# Patient Record
Sex: Female | Born: 1985 | Race: White | Hispanic: No | Marital: Married
Health system: Southern US, Community
[De-identification: ages and names within clinical notes are randomized; demographics above are authoritative.]

---

## 2010-10-22 ENCOUNTER — Emergency Department: Payer: Self-pay | Admitting: Emergency Medicine

## 2010-10-31 ENCOUNTER — Ambulatory Visit: Payer: Self-pay | Admitting: Unknown Physician Specialty

## 2011-03-03 ENCOUNTER — Emergency Department: Payer: Self-pay | Admitting: Unknown Physician Specialty

## 2011-03-18 ENCOUNTER — Emergency Department: Payer: Self-pay | Admitting: Emergency Medicine

## 2011-03-20 ENCOUNTER — Emergency Department: Payer: Self-pay | Admitting: Emergency Medicine

## 2011-07-09 ENCOUNTER — Emergency Department: Payer: Self-pay | Admitting: Emergency Medicine

## 2011-10-23 ENCOUNTER — Observation Stay: Payer: Self-pay

## 2011-10-23 LAB — URINALYSIS, COMPLETE
Bilirubin,UR: NEGATIVE
Blood: NEGATIVE
Glucose,UR: NEGATIVE mg/dL (ref 0–75)
Leukocyte Esterase: NEGATIVE
Nitrite: NEGATIVE
Ph: 7 (ref 4.5–8.0)
Protein: NEGATIVE
RBC,UR: 10 /HPF (ref 0–5)
Squamous Epithelial: 1
WBC UR: 1 /HPF (ref 0–5)

## 2011-10-25 LAB — URINE CULTURE

## 2011-11-16 ENCOUNTER — Observation Stay: Payer: Self-pay | Admitting: Obstetrics and Gynecology

## 2011-11-20 ENCOUNTER — Observation Stay: Payer: Self-pay | Admitting: Obstetrics and Gynecology

## 2011-11-20 ENCOUNTER — Inpatient Hospital Stay: Payer: Self-pay | Admitting: Obstetrics and Gynecology

## 2011-11-20 LAB — CBC WITH DIFFERENTIAL/PLATELET
Basophil #: 0 10*3/uL (ref 0.0–0.1)
Basophil %: 0.1 %
Eosinophil #: 0 10*3/uL (ref 0.0–0.7)
Eosinophil %: 0 %
HCT: 32.2 % — ABNORMAL LOW (ref 35.0–47.0)
MCH: 33.1 pg (ref 26.0–34.0)
MCV: 97 fL (ref 80–100)
Monocyte #: 1.3 x10 3/mm — ABNORMAL HIGH (ref 0.2–0.9)
Monocyte %: 5.3 %
Neutrophil #: 21 10*3/uL — ABNORMAL HIGH (ref 1.4–6.5)
Neutrophil %: 88.5 %
Platelet: 182 10*3/uL (ref 150–440)
RDW: 13.5 % (ref 11.5–14.5)

## 2011-11-28 ENCOUNTER — Ambulatory Visit: Payer: Self-pay

## 2012-02-17 IMAGING — US US PELV - US TRANSVAGINAL
1 series · 17 of 25 positions shown · non-contrast
Comparison: none

REASON FOR EXAM: adnexal mass
COMMENTS:

[Series 1: us pelv - us transvaginal · 17 of 80 slices shown]
[im 1/80]
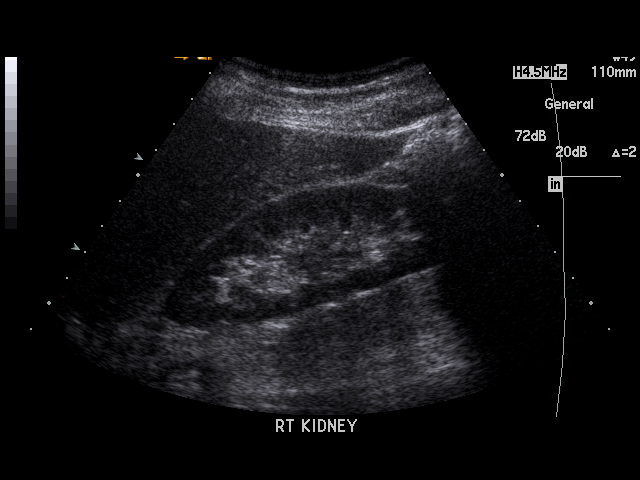
[im 7/80]
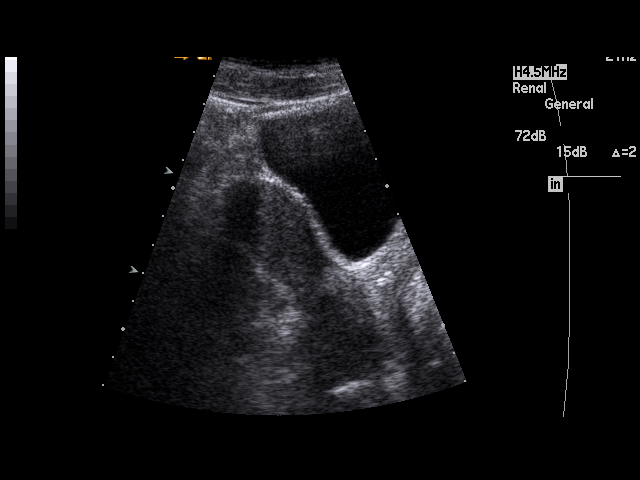
[im 10/80]
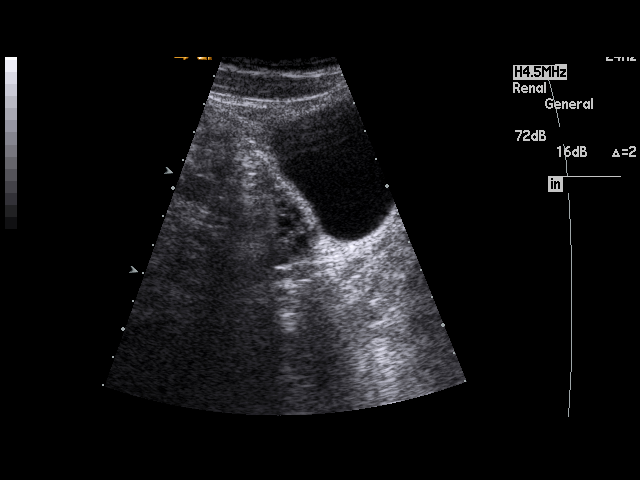
[im 17/80]
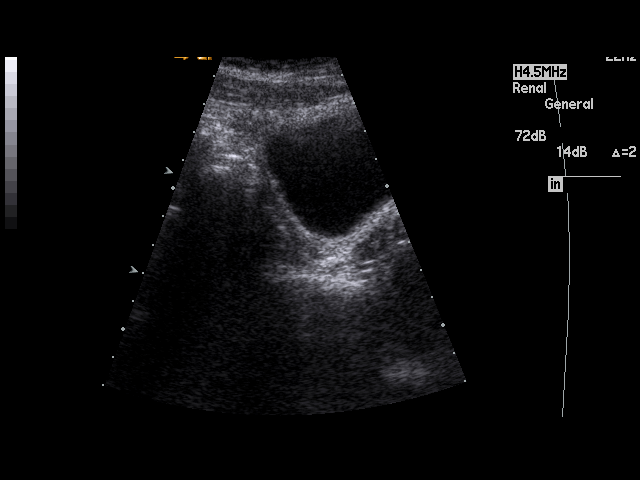
[im 20/80]
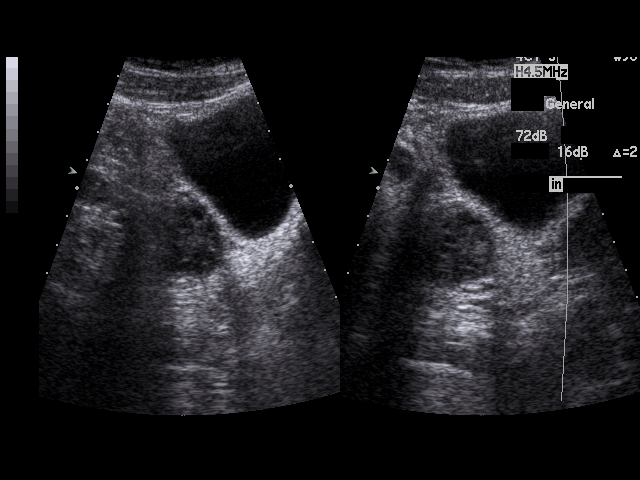
[im 27/80]
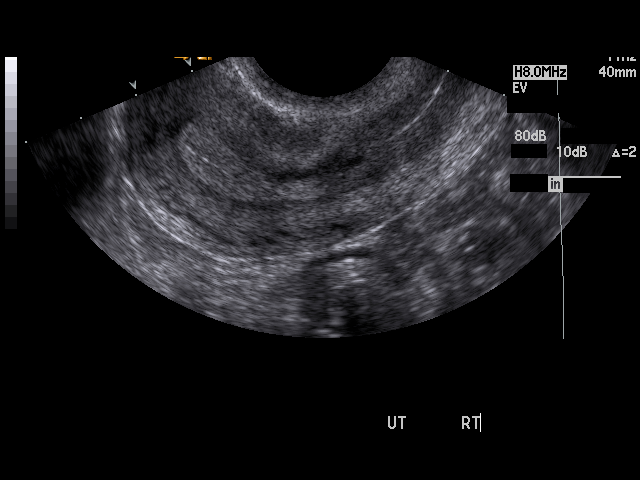
[im 30/80]
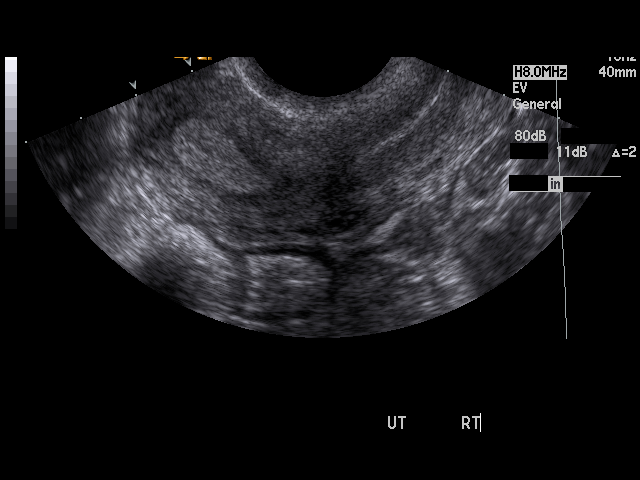
[im 37/80]
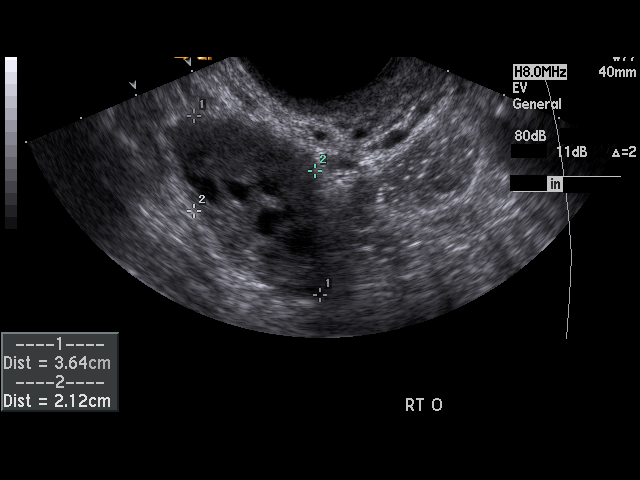
[im 40/80]
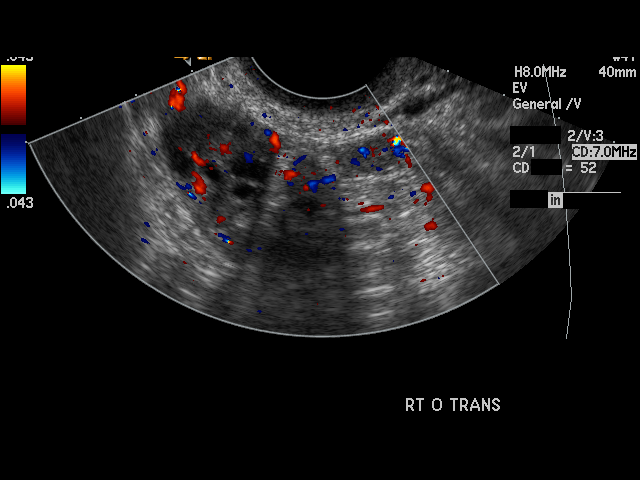
[im 43/80]
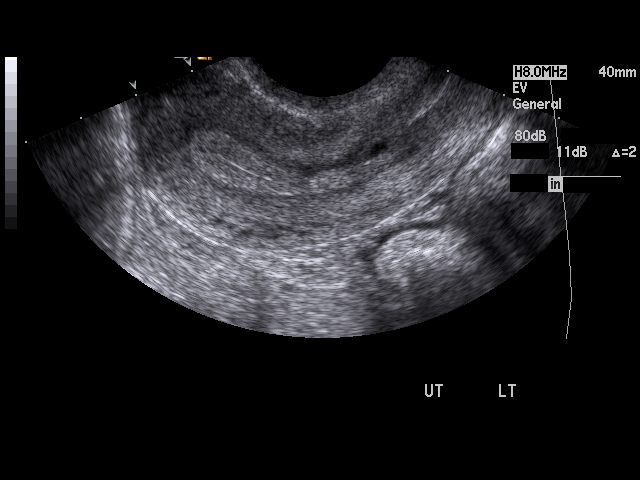
[im 50/80]
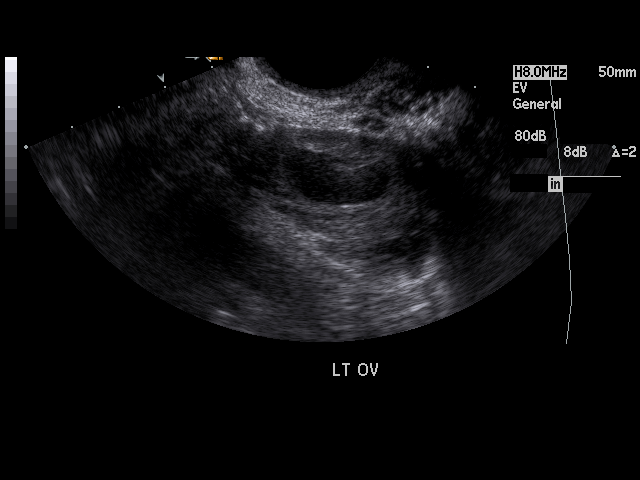
[im 53/80]
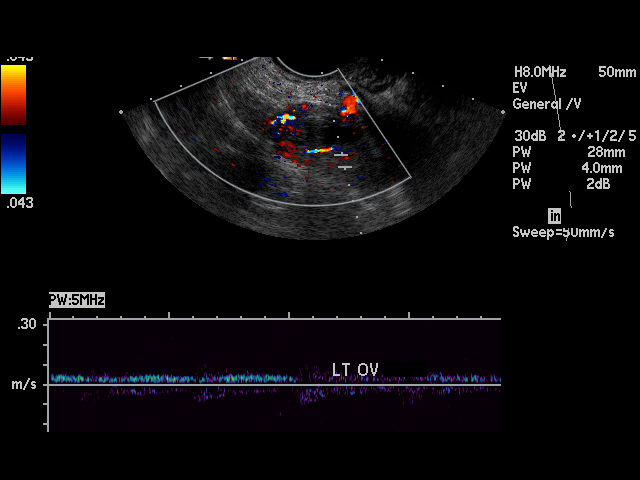
[im 60/80]
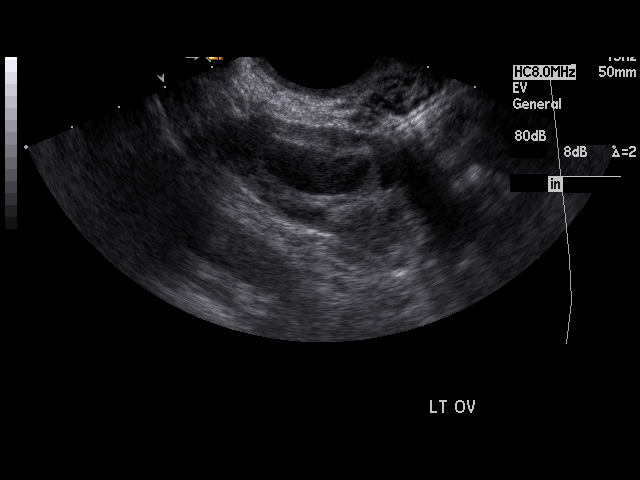
[im 63/80]
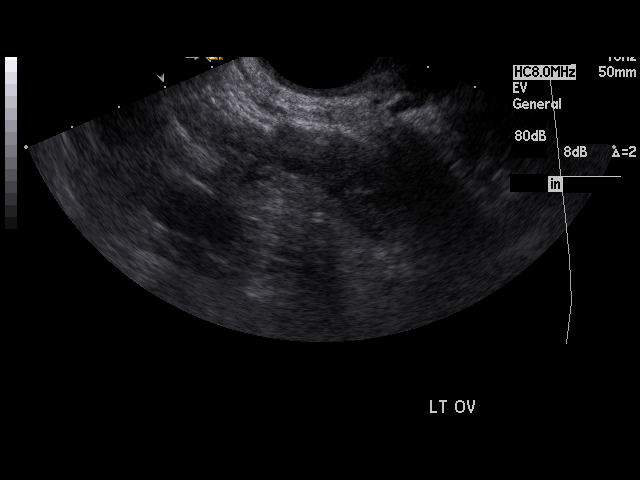
[im 70/80]
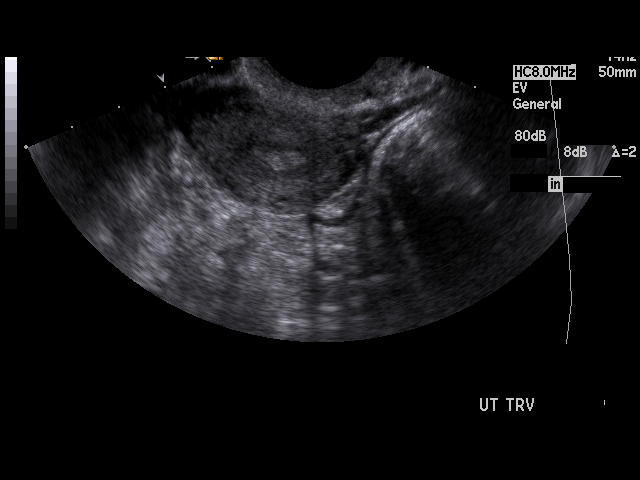
[im 73/80]
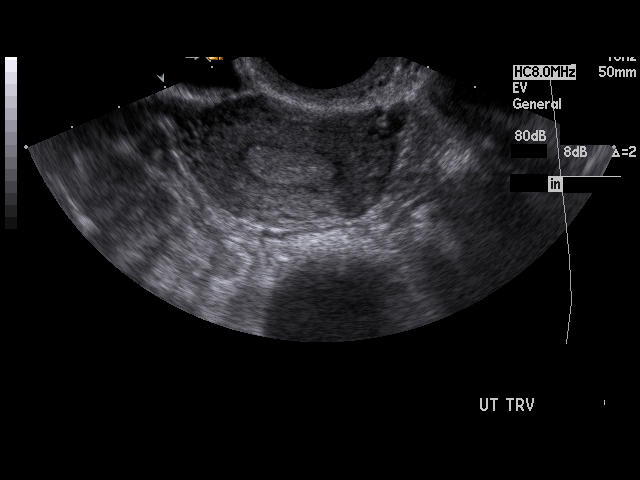
[im 80/80]
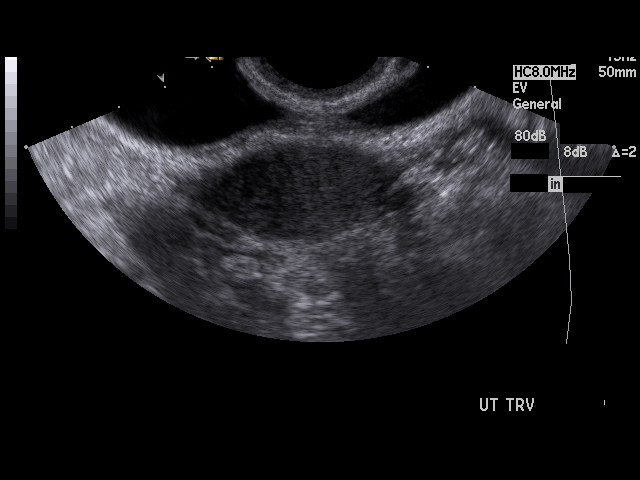

[17 of 25 positions shown; findings below may reference images not displayed]

PROCEDURE:     US  - US PELVIS EXAM W/TRANSVAGINAL  - October 31, 2010  [DATE]

RESULT:     Comparison is made to a prior study dated 10/23/2010.

The uterus measures 9.3 x 3.2 x 4.3 cm. Endometrial thickness is 9.1 mm. The
uterus demonstrates homogeneous echotexture. The right ovary demonstrates
very small follicles and otherwise a homogeneous echotexture and measures
3.6 x 2.1 x 2 cm. Color flow and arterial and venous waveforms are
identified within the right ovary.

The left ovary measures 4.3 x 2.6 x 2.8 cm. A dominant cyst is identified
within the left ovary with internal echoes measuring 2.2 cm. Arterial and
venous waveforms are identified within the left ovary.
IMPRESSION: 1.  Left ovarian cyst slightly enlarged when compared to the previous study
with internal echoes likely representing a hemorrhagic cyst.
2.  Otherwise, no significant change in Pelvic Ultrasound. Surveillance
evaluation is recommended, if and as clinically warranted.

## 2012-04-27 ENCOUNTER — Ambulatory Visit: Payer: Self-pay | Admitting: Orthopedic Surgery

## 2012-07-15 ENCOUNTER — Emergency Department: Payer: Self-pay | Admitting: Emergency Medicine

## 2012-08-06 ENCOUNTER — Encounter: Payer: Self-pay | Admitting: Anesthesiology

## 2012-08-22 ENCOUNTER — Encounter: Payer: Self-pay | Admitting: Anesthesiology

## 2013-11-01 IMAGING — CR DG HIP COMPLETE 2+V*L*
1 series · 2 of 2 positions shown · non-contrast
Comparison: none

REASON FOR EXAM: fall
COMMENTS:

PROCEDURE:     DXR - DXR HIP LEFT COMPLETE  - July 15, 2012  [DATE]
RESULT:     Comparison:  None

[Series 1: t hip ap left · 0.14mm/px · 2 of 2 slices shown]
[im 1/2]
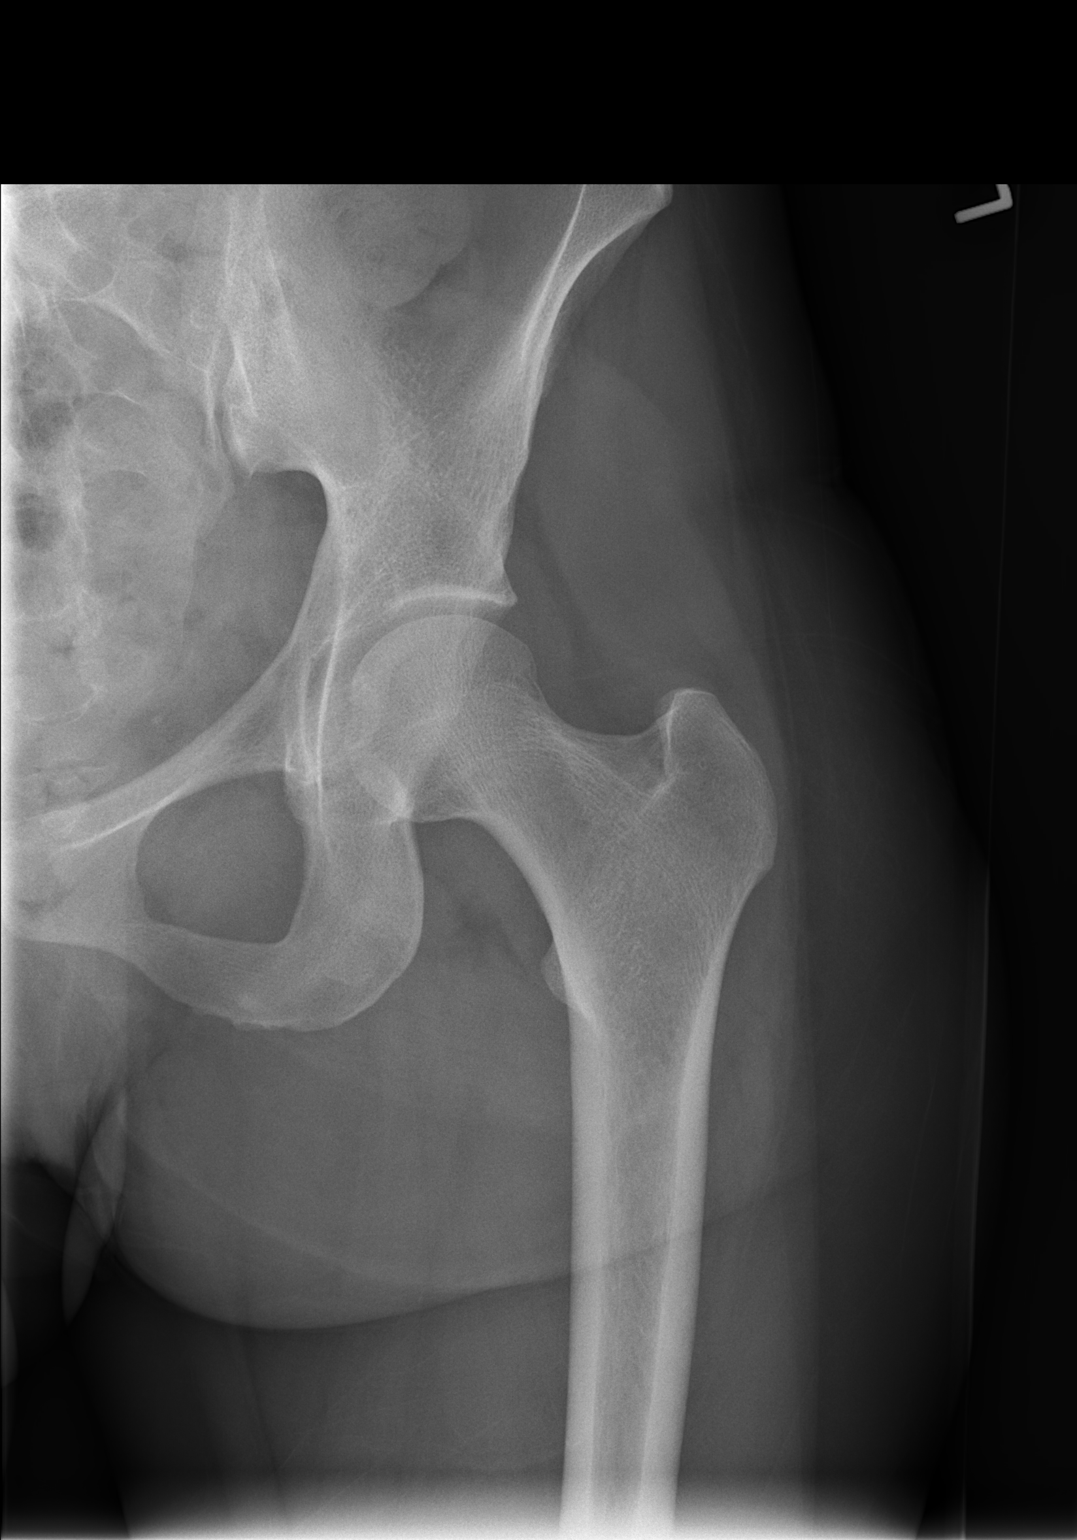
[im 2/2]
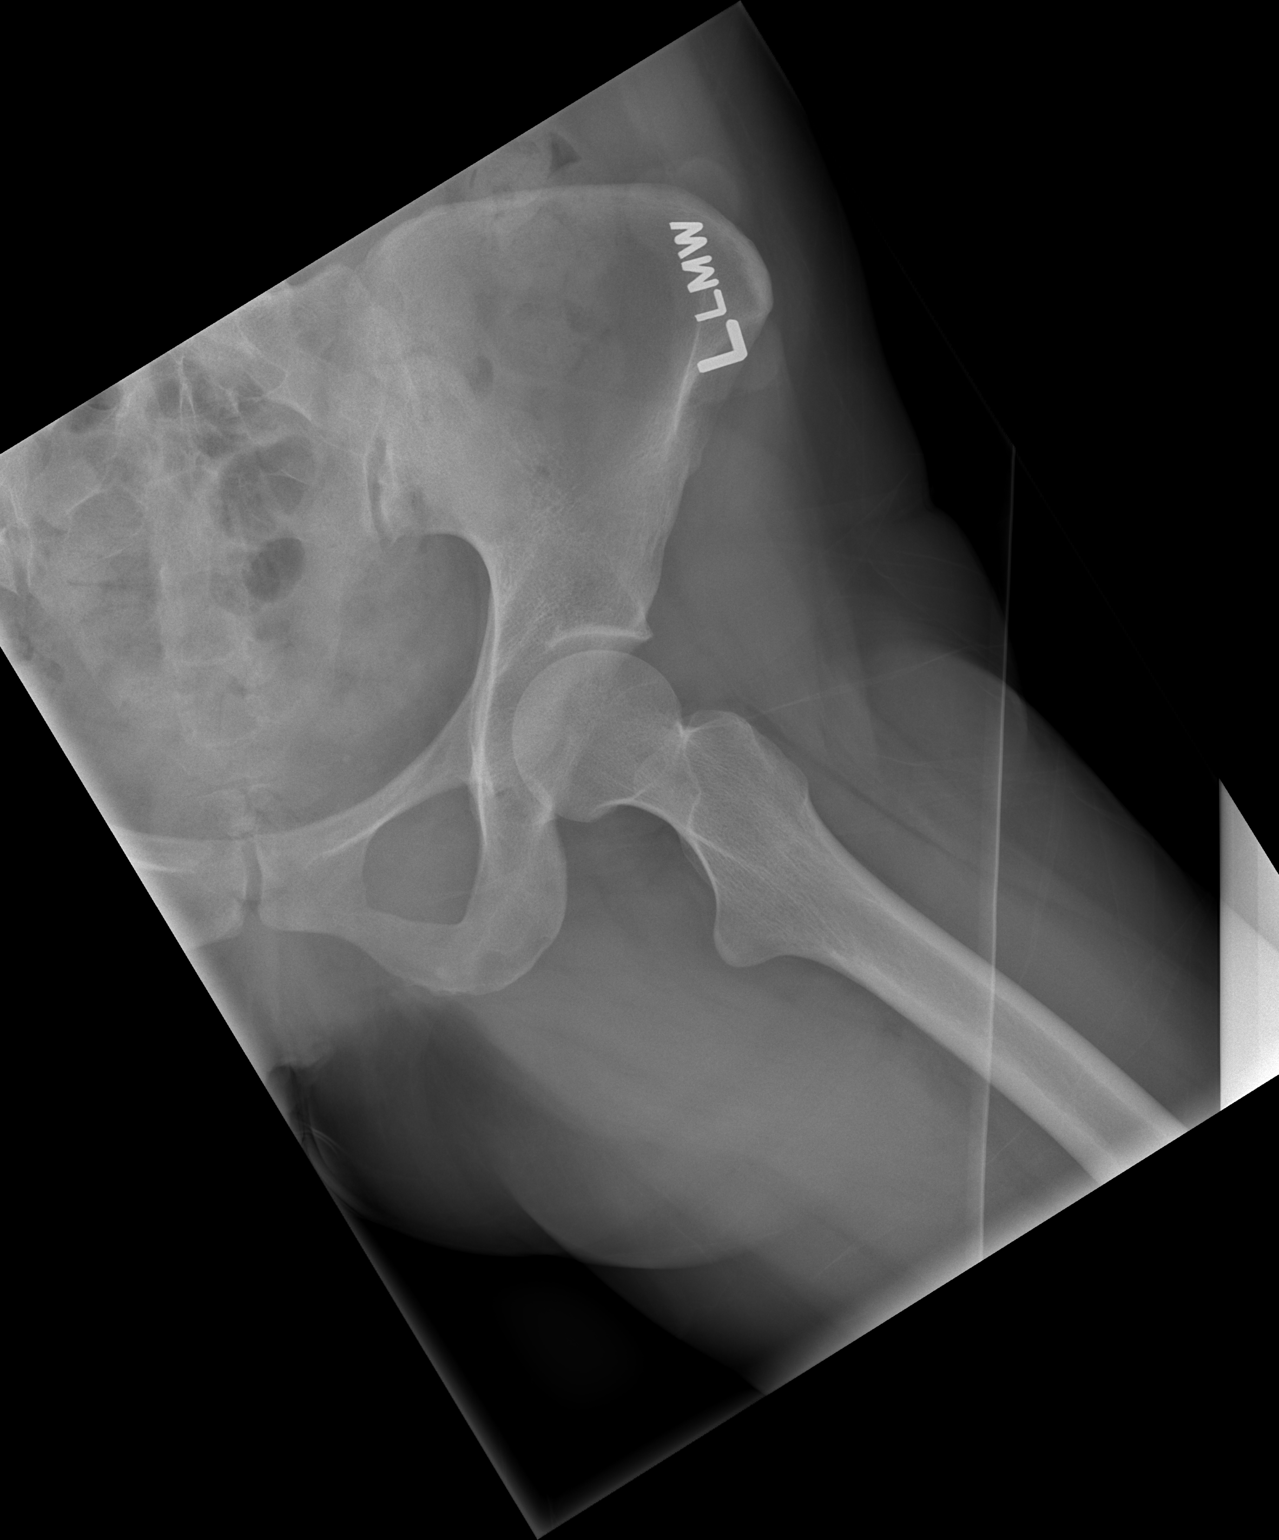

[2 of 2 positions shown; findings below may reference images not displayed]

FINDINGS: AP and frogleg lateral view of the left hip demonstrates no fracture or
dislocation. The joint space is maintained.
IMPRESSION: No acute osseous injury of the left hip.

[REDACTED]

## 2014-11-29 NOTE — H&P (Signed)
L&D Evaluation:  History:   HPI 29 year old G1 P0 with EDC=11/19/2011 by a LMP=02/12/2011 presents at 2036 1/7 weeks with c/o lower abdominal and back pain which has worsened today and she rates the pain a 9/10. The pain in her back is constant and the lower abdominal pain can be sharp and stabbing.She feels crampy.She has not tried any heat, ice, or analgesia for the pain. Denies dysuria, bleeding, LOF, N/V, diarrhea. Baby active. [facility] for late entry to care at 25 weeks, a normal anatomy scan at 28 weeks, headaches, sinisitis, and smoking. She received Rhogam at 28 weeks. O POS, VI, RI.    Presents with abdominal pain, back pain    Patient's Medical History Headaches    Patient's Surgical History Cryo of cervix    Medications Pre Natal Vitamins  Fioricet for headaches prn    Allergies PCN, amoxicillin    Social History tobacco    Family History Non-Contributory   ROS:   ROS see HPI   Exam:   Vital Signs stable  116/63    Urine Protein negative dipstick, UA with 10 RBCs/HPF otherwise negative    General appeared uncomfortable initially moving in bed, petite gravid female    Mental Status clear    Abdomen gravid, tender in LUS; NT upper abdomen    Estimated Fetal Weight Average for gestational age    Fetal Position vtx on US    Back tender paraspinal muscles left > right    Edema no edema    Pelvic no external lesions, 1/60% per RN exam (no change from office yesterday)    Mebranes Intact    FHT normal rate with no decels, initially 140s with accels to 160s, then 135 baseline with accels160s to 170    FHT Description moderate variability    Ucx occasional ctx, some irritability    Skin dry   Impression:   Impression IUP at 36 1/7 weeks with pelvic pressure/back pain (musculoskeletal tiology)   Plan:   Plan Given Tylenol #3 with RX for #20 to take with food. Encouraged heat to back. FU as scheduled or sooner for S&S of labor.    Electronic Signatures: Trinna BalloonGutierrez, Lanny Donoso L (CNM)  (Signed 03-Apr-13 20:54)  Authored: L&D Evaluation   Last Updated: 03-Apr-13 20:54 by Trinna BalloonGutierrez, Samatha Anspach L (CNM)
